# Patient Record
Sex: Male | Born: 1985 | Race: Black or African American | Hispanic: No | Marital: Single | Smoking: Never smoker
Health system: Southern US, Community
[De-identification: ages and names within clinical notes are randomized; demographics above are authoritative.]

## PROBLEM LIST (undated history)

## (undated) DIAGNOSIS — I10 Essential (primary) hypertension: Secondary | ICD-10-CM

---

## 2017-11-13 ENCOUNTER — Emergency Department (HOSPITAL_COMMUNITY)
Admission: EM | Admit: 2017-11-13 | Discharge: 2017-11-13 | Disposition: A | Discharge status: Home / Self Care | Payer: Self-pay | Attending: Emergency Medicine | Admitting: Emergency Medicine

## 2017-11-13 ENCOUNTER — Other Ambulatory Visit: Payer: Self-pay

## 2017-11-13 ENCOUNTER — Encounter (HOSPITAL_COMMUNITY): Payer: Self-pay | Admitting: Emergency Medical Services

## 2017-11-13 DIAGNOSIS — H109 Unspecified conjunctivitis: Secondary | ICD-10-CM

## 2017-11-13 DIAGNOSIS — H1033 Unspecified acute conjunctivitis, bilateral: Secondary | ICD-10-CM | POA: Insufficient documentation

## 2017-11-13 MED ORDER — POLYMYXIN B-TRIMETHOPRIM 10000-0.1 UNIT/ML-% OP SOLN: 1.0000 [drp] | OPHTHALMIC | 0 refills | Status: AC

## 2017-11-13 MED ORDER — TETRACAINE HCL 0.5 % OP SOLN
1.0000 [drp] | Freq: Once | OPHTHALMIC | Status: AC
  Administered 2017-11-13: 1 [drp] via OPHTHALMIC
  Filled 2017-11-13: qty 4

## 2017-11-13 NOTE — ED Notes (Signed)
Visual Acuity OS: 20/20 OD: 20/70

## 2017-11-13 NOTE — ED Notes (Signed)
Patient verbalized understanding of discharge instructions, no questions. Patient ambulated out of ED with steady gait in no distress.  

## 2017-11-13 NOTE — ED Triage Notes (Signed)
Patient arrives with c/o right eye pain. Patient states 4 weeks ago patient got something in eye, flushed eye, but has had swelling, tearing, and redness. Patient denies vision changes.

## 2017-11-13 NOTE — Discharge Instructions (Signed)
Please take antibiotic eyedrops for the next 5 days as prescribed.  Call and make an appointment with the eye doctor if your symptoms are not improving, (Dr. Alben Spittle).  I have listed information to his office below.

## 2017-11-13 NOTE — ED Provider Notes (Signed)
[facility]-EMERGENCY DEPT Provider Note   CSN: [id number] Arrival date & time: 11/13/17  1341     History   Chief Complaint Chief Complaint  Patient presents with  . Eye Pain    HPI Steven Valencia is a 32 y.o. male.  HPI   Steven Valencia is a 32yo male with no significant past medical history who presents to the emergency department for evaluation of red eye.  Patient reports that he felt something get stuck in his eye while at work 4 weeks ago.  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  He states that he flushed the eye out with cold water.  Ever since his right eye has been red and irritated.  He states that he has had clear and thick drainage from the eye.  He tried Visine drops and allergy drops without relief.  He denies changes in his vision, other than when the drainage starts to occlude his view.  He denies eye pain or painful EOMs.  He reports that the right eye also feels itchy.  No recent injury to the eye.  He denies headache, photophobia, vomiting, fever.  No known contacts with similar symptoms.  History reviewed. No pertinent past medical history.  There are no active problems to display for this patient.   History reviewed. No pertinent surgical history.      Home Medications    Prior to Admission medications   Medication Sig Start Date End Date Taking? Authorizing Provider  Multiple Vitamin (MULTIVITAMIN WITH MINERALS) TABS tablet Take 1 tablet by mouth daily.   Yes [provider]  trimethoprim-polymyxin b (POLYTRIM) ophthalmic solution Place 1 drop into the right eye every 4 (four) hours for 5 days. 11/13/17 11/18/17  Kellie Shropshire, PA-C    Family History No family history on file.  Social History Social History   Tobacco Use  . Smoking status: Not on file  Substance Use Topics  . Alcohol use: Not on file  . Drug use: Not on file     Allergies   Patient has  no known allergies.   Review of Systems Review of Systems Constitutional: Negative for chills and fever.  Eyes: Positive for discharge, redness and itching. Negative for photophobia, pain and visual disturbance.   Physical Exam Updated Vital Signs BP 132/88 (BP Location: Right Arm)   Pulse 79   Temp 98.5 F (36.9 C) (Oral)   Resp 16   Ht 5\' 7"  (1.702 m)   Wt 115.2 kg   SpO2 98%   BMI 39.78 kg/m   Physical Exam Constitutional: He appears well-developed and well-nourished. No distress.  HENT:  Head: Normocephalic and atraumatic.  Eyes: Right eye exhibits no discharge. Left eye exhibits no discharge.  Appearance. Right eye with conjunctival injection and drainage. PERRL intact. EOMI without pain or nystagmus. No consensual photophobia.  Corneal Abrasion Exam VCO. Risks, benefits and alternatives explained. 2 drops of tetracaine (PONTOCAINE) 0.5 % ophthalmic solution were applied to the right eye. Fluorescein 1 MG ophthalmic strip applied the the surface of the right eye Woods lamp used to screen for abrasion. No increase in fluorescein uptake. No corneal ulcer. No hyphema. No dendritic lesion. Negative Seidel sign. No foreign bodies noted. Eye flushed with sterile saline Patient tolerated the procedure well TONOPEN: 22RE  Pulmonary/Chest: Effort normal. No respiratory distress.  Neurological: He is alert. Coordination normal.  Skin: He is not diaphoretic.  Psychiatric: He has a normal mood and affect. His behavior is normal.  Nursing note and vitals reviewed.   ED Treatments / Results  Labs (all labs ordered are listed, but only abnormal results are displayed) Labs Reviewed - No data to display  EKG None  Radiology No results found.  Procedures Procedures (including critical care time)  Medications Ordered in ED Medications  tetracaine (PONTOCAINE) 0.5 % ophthalmic solution 1 drop (1 drop Right Eye Given 11/13/17 1501)     Initial Impression / Assessment and  Plan / ED Course  I have reviewed the triage vital signs and the nursing notes.  Pertinent labs & imaging results that were available during my care of the patient were reviewed by me and considered in my medical decision making (see chart for details).     Steven Valencia presents with symptoms consistent with bacterial conjunctivitis.  Purulent discharge exam.  No corneal abrasions, entrapment, consensual photophobia, or dendritic staining with fluorescein study.  Presentation non-concerning for iritis, corneal abrasions, or HSV.  No evidence of preseptal or orbital cellulitis.  Pt is not a contact lens wearer.  Patient will be given polytrim drops.  Personal hygiene and frequent handwashing discussed. Patient advised to followup with ophthalmologist for reevaluation if no improvement in symptoms. Patient verbalizes understanding and is agreeable with discharge.  Final Clinical Impressions(s) / ED Diagnoses   Final diagnoses:  Bacterial conjunctivitis of right eye    ED Discharge Orders         Ordered    trimethoprim-polymyxin b (POLYTRIM) ophthalmic solution  Every 4 hours     11/13/17 1454           Lawrence Marseilles 11/13/17 1830    Azalia Bilis, MD 11/14/17 2139

## 2018-08-20 ENCOUNTER — Emergency Department (HOSPITAL_COMMUNITY)
Admission: EM | Admit: 2018-08-20 | Discharge: 2018-08-20 | Disposition: A | Discharge status: Home / Self Care | Payer: Self-pay | Attending: Emergency Medicine | Admitting: Emergency Medicine

## 2018-08-20 ENCOUNTER — Emergency Department (HOSPITAL_COMMUNITY): Payer: Self-pay

## 2018-08-20 ENCOUNTER — Other Ambulatory Visit: Payer: Self-pay

## 2018-08-20 ENCOUNTER — Encounter (HOSPITAL_COMMUNITY): Payer: Self-pay

## 2018-08-20 DIAGNOSIS — Z79899 Other long term (current) drug therapy: Secondary | ICD-10-CM | POA: Insufficient documentation

## 2018-08-20 DIAGNOSIS — R0789 Other chest pain: Secondary | ICD-10-CM | POA: Insufficient documentation

## 2018-08-20 DIAGNOSIS — R202 Paresthesia of skin: Secondary | ICD-10-CM | POA: Insufficient documentation

## 2018-08-20 DIAGNOSIS — R0981 Nasal congestion: Secondary | ICD-10-CM | POA: Insufficient documentation

## 2018-08-20 LAB — BASIC METABOLIC PANEL
Anion gap: 13 (ref 5–15)
BUN: 10 mg/dL (ref 6–20)
CO2: 22 mmol/L (ref 22–32)
Calcium: 9 mg/dL (ref 8.9–10.3)
Chloride: 106 mmol/L (ref 98–111)
Creatinine, Ser: 0.85 mg/dL (ref 0.61–1.24)
GFR calc Af Amer: >60 mL/min (ref >60)
GFR calc non Af Amer: >60 mL/min (ref >60)
Glucose, Bld: 127 mg/dL — ABNORMAL HIGH (ref 70–99)
Potassium: 4.1 mmol/L (ref 3.5–5.1)
Sodium: 141 mmol/L (ref 135–145)

## 2018-08-20 LAB — CBG MONITORING, ED: Glucose-Capillary: 123 mg/dL — ABNORMAL HIGH (ref 70–99)

## 2018-08-20 LAB — CBC
HCT: 45 % (ref 39.0–52.0)
Hemoglobin: 15.7 g/dL (ref 13.0–17.0)
MCH: 27.2 pg (ref 26.0–34.0)
MCHC: 34.9 g/dL (ref 30.0–36.0)
MCV: 78 fL — ABNORMAL LOW (ref 80.0–100.0)
Platelets: 275 10*3/uL (ref 150–400)
RBC: 5.77 MIL/uL (ref 4.22–5.81)
RDW: 14.8 % (ref 11.5–15.5)
WBC: 9.5 10*3/uL (ref 4.0–10.5)
nRBC: 0 % (ref 0.0–0.2)

## 2018-08-20 MED ORDER — FLUTICASONE PROPIONATE 50 MCG/ACT NA SUSP: 2.0000 | Freq: Every day | NASAL | 0 refills | Status: DC

## 2018-08-20 NOTE — ED Provider Notes (Signed)
[facility]-EMERGENCY DEPT Provider Note   CSN: [id number] Arrival date & time: 08/20/18  [id number]    History   Chief Complaint Chief Complaint  Patient presents with   Generalized Body Aches    HPI Steven Valencia is a 33 y.o. male presents today for evaluation of acute onset, persistent subjective fevers and chills beginning last night.  Did not check his temperature but was able to go to sleep comfortably and awoke at 5 AM this morning with right-sided chest pain.  He describes it as a tightness.  It does not radiate.  It has been constant since it began at 5 AM, does not radiate.  He reports he has had similar pains previously that worsen laying flat, improves with rest.  It is not associated with nausea, vomiting, diaphoresis, lightheadedness, or shortness of breath.  He has not tried anything for his symptoms. No recent travel or surgeries, no hemoptysis, no leg swelling/pain, no hormone replacement therapy, no prior history of DVT or PE. Does note mild sinus pressure and nasal congestion this morning but states this is due to sleeping with a fan on.   Reports for the last 6 months or so he has had intermittent and progressively worsening in frequency paresthesias of all of his fingertips and sometimes all of his toes.  Symptoms come on with certain movements and activity.  He does not use tobacco products, smokes marijuana often, denies excessive alcohol intake.  No family history of cardiac disease at a young age.      The history is provided by the patient.    History reviewed. No pertinent past medical history.  There are no active problems to display for this patient.   No past surgical history on file.      Home Medications    Prior to Admission medications   Medication Sig Start Date End Date Taking? Authorizing Provider  Multiple Vitamin (MULTIVITAMIN WITH MINERALS) TABS tablet Take 1 tablet by mouth daily.   Yes [provider]    fluticasone (FLONASE) 50 MCG/ACT nasal spray Place 2 sprays into both nostrils daily. 08/20/18   Jeanie SewerFawze, Quanisha Drewry A, PA-C    Family History No family history on file.  Social History Social History   Tobacco Use   Smoking status: Never Smoker   Smokeless tobacco: Never Used  Substance Use Topics   Alcohol use: Not on file   Drug use: Not on file     Allergies   Patient has no known allergies.   Review of Systems Review of Systems  Constitutional: Negative for chills and fever.  Respiratory: Negative for shortness of breath.   Cardiovascular: Positive for chest pain.  All other systems reviewed and are negative.    Physical Exam Updated Vital Signs BP 135/87    Pulse 83    Temp 98.3 F (36.8 C) (Oral)    Resp 15    Ht 5\' 7"  (1.702 m)    Wt 127 kg    SpO2 95%    BMI 43.85 kg/m   Physical Exam Vitals signs and nursing note reviewed.  Constitutional:      General: He is not in acute distress.    Appearance: He is well-developed.     Comments: Resting comfortably in bed  HENT:     Head: Normocephalic and atraumatic.  Eyes:     General:        Right eye: No discharge.        Left eye: No discharge.  Conjunctiva/sclera: Conjunctivae normal.  Neck:     Musculoskeletal: Normal range of motion and neck supple.     Vascular: No JVD.     Trachea: No tracheal deviation.  Cardiovascular:     Rate and Rhythm: Normal rate and regular rhythm.     Pulses: Normal pulses.     Heart sounds: Normal heart sounds.     Comments: 2+ radial and DP/PT pulses bilaterally, Homans sign absent bilaterally, no lower extremity edema, no palpable cords, compartments are soft  Pulmonary:     Effort: Pulmonary effort is normal.     Breath sounds: Normal breath sounds.  Abdominal:     General: Abdomen is flat. Bowel sounds are normal. There is no distension.     Palpations: Abdomen is soft.     Tenderness: There is no abdominal tenderness. There is no guarding or rebound.  Skin:     General: Skin is warm and dry.     Findings: No erythema.  Neurological:     General: No focal deficit present.     Mental Status: He is alert.     Cranial Nerves: No cranial nerve deficit.     Sensory: No sensory deficit.     Motor: No weakness.     Coordination: Coordination normal.     Gait: Gait normal.     Comments: Fluent speech, no facial droop, cranial nerves appear grossly intact.  Moves extremities spontaneously with intact strength  Psychiatric:        Behavior: Behavior normal.      ED Treatments / Results  Labs (all labs ordered are listed, but only abnormal results are displayed) Labs Reviewed  BASIC METABOLIC PANEL - Abnormal; Notable for the following components:      Result Value   Glucose, Bld 127 (*)    All other components within normal limits  CBC - Abnormal; Notable for the following components:   MCV 78.0 (*)    All other components within normal limits  CBG MONITORING, ED - Abnormal; Notable for the following components:   Glucose-Capillary 123 (*)    All other components within normal limits    EKG EKG Interpretation  Date/Time:  Wednesday August 20 2018 09:24:57 EDT Ventricular Rate:  72 PR Interval:    QRS Duration: 79 QT Interval:  379 QTC Calculation: 415 R Axis:   42 Text Interpretation:  Sinus rhythm Low voltage, precordial leads No old tracing to compare Confirmed by Mancel BaleWentz, Elliott [phone]([id number]) on 08/20/2018 9:28:15 AM   Radiology Dg Chest 2 View  Result Date: 08/20/2018 CLINICAL DATA:  Chest pain. EXAM: CHEST - 2 VIEW COMPARISON:  None. FINDINGS: The heart size and mediastinal contours are within normal limits. Both lungs are clear. No pneumothorax or pleural effusion is noted. The visualized skeletal structures are unremarkable. IMPRESSION: No active cardiopulmonary disease. Electronically Signed   By: Lupita RaiderJames  Green Jr M.D.   On: 08/20/2018 09:25    Procedures Procedures (including critical care time)  Medications Ordered in  ED Medications - No data to display   Initial Impression / Assessment and Plan / ED Course  I have reviewed the triage vital signs and the nursing notes.  Pertinent labs & imaging results that were available during my care of the patient were reviewed by me and considered in my medical decision making (see chart for details).       Steven Valencia was evaluated in Emergency Department on 08/20/2018 for the symptoms described in the history of present illness. He  was evaluated in the context of the global COVID-19 pandemic, which necessitated consideration that the patient might be at risk for infection with the SARS-CoV-2 virus that causes COVID-19. Institutional protocols and algorithms that pertain to the evaluation of patients at risk for COVID-19 are in a state of rapid change based on information released by regulatory bodies including the CDC and federal and state organizations. These policies and algorithms were followed during the patient's care in the ED.  Patient presents for evaluation of subjective fevers since yesterday, right-sided chest pain today.  He is afebrile, initially hypertensive with resolution on reevaluation.  Nontoxic in appearance.  Pain is not exertional, pleuritic, or reproducible on palpation.  Has some sinus congestion today, also complains of intermittent paresthesias of the hands and feet for the last 6 months or so.  No focal neurologic deficits, normal neurologic examination and I doubt acute intracranial abnormality.  EKG shows normal sinus rhythm, no ischemic abnormalities or arrhythmia.  His symptoms sound very atypical and I doubt ACS/MI in a young individual with otherwise no risk factors.  He is PERC negative, doubt PE in this patient with no shortness of breath, increased work of breathing, tachycardia, hypoxia, or tachypnea.  Chest x-ray shows no acute cardiopulmonary abnormalities.  Blood work reviewed by me shows no leukocytosis, no anemia, no metabolic  derangements.  He is mildly hyperglycemic but doubt underlying/undiagnosed diabetes.  He is resting comfortably on reevaluation and no apparent distress.  Denies any pain.  Doubt dissection, cardiac tamponade, esophageal rupture, pneumothorax, or CHF.  No evidence of pericarditis or myocarditis.  Offered outpatient COVID-19 test which the patient declined, stating he had a negative test 3 weeks ago.  I think this is reasonable as he is no significant infectious symptoms and has been afebrile while in the ED, has not taken any antipyretics yesterday or today.  Recommend follow-up with his PCP for reevaluation of symptoms.  Discuss strict ED return precautions. Patient verbalized understanding of and agreement with plan and is safe for discharge home at this time.   Final Clinical Impressions(s) / ED Diagnoses   Final diagnoses:  Atypical chest pain  Paresthesia  Nasal congestion    ED Discharge Orders         Ordered    fluticasone (FLONASE) 50 MCG/ACT nasal spray  Daily     08/20/18 [address] PA-C 08/20/18 1056    Daleen Bo, MD 08/20/18 [phone]

## 2018-08-20 NOTE — ED Notes (Signed)
ED Provider at bedside. 

## 2018-08-20 NOTE — Discharge Instructions (Signed)
Your work-up today was reassuring, with no signs of heart attack, pneumonia, heart enlargement, or fluid in the lungs.   Use Flonase for nasal congestion.  Drink plenty fluids and plenty of rest.  Take ibuprofen or Tylenol for fever pain.  Follow-up with your primary care provider for reevaluation of your right-sided chest pains and the numbness and tingling in your hands and feet.  Return to the emergency department if any concerning signs or symptoms develop such as severe chest pains, shortness of breath, persistent vomiting, weakness, or loss of consciousness.

## 2018-08-20 NOTE — ED Notes (Addendum)
Patient ambulated to X-ray with X-Ray Tech.

## 2018-08-20 NOTE — ED Triage Notes (Addendum)
He states that, since yesterday he has at times felt "like I've got a fever--I never took my temperature". He also c/o paresthesias of all fingers and toes at times. He ie healthy-looking, ambulatory and in no distress. He also mentions feeling "kinda tight in my chest sometimes". [sic].

## 2018-08-29 ENCOUNTER — Other Ambulatory Visit: Payer: Self-pay

## 2018-08-29 ENCOUNTER — Emergency Department (HOSPITAL_COMMUNITY)
Admission: EM | Admit: 2018-08-29 | Discharge: 2018-08-29 | Disposition: A | Discharge status: Home / Self Care | Payer: Self-pay | Attending: Emergency Medicine | Admitting: Emergency Medicine

## 2018-08-29 ENCOUNTER — Encounter (HOSPITAL_COMMUNITY): Payer: Self-pay

## 2018-08-29 DIAGNOSIS — Z20822 Contact with and (suspected) exposure to covid-19: Secondary | ICD-10-CM

## 2018-08-29 DIAGNOSIS — R0602 Shortness of breath: Secondary | ICD-10-CM | POA: Insufficient documentation

## 2018-08-29 DIAGNOSIS — Z20828 Contact with and (suspected) exposure to other viral communicable diseases: Secondary | ICD-10-CM | POA: Insufficient documentation

## 2018-08-29 DIAGNOSIS — R05 Cough: Secondary | ICD-10-CM | POA: Insufficient documentation

## 2018-08-29 DIAGNOSIS — R0789 Other chest pain: Secondary | ICD-10-CM | POA: Insufficient documentation

## 2018-08-29 DIAGNOSIS — M791 Myalgia, unspecified site: Secondary | ICD-10-CM | POA: Insufficient documentation

## 2018-08-29 DIAGNOSIS — R0981 Nasal congestion: Secondary | ICD-10-CM | POA: Insufficient documentation

## 2018-08-29 DIAGNOSIS — J029 Acute pharyngitis, unspecified: Secondary | ICD-10-CM | POA: Insufficient documentation

## 2018-08-29 DIAGNOSIS — R509 Fever, unspecified: Secondary | ICD-10-CM | POA: Insufficient documentation

## 2018-08-29 DIAGNOSIS — Z79899 Other long term (current) drug therapy: Secondary | ICD-10-CM | POA: Insufficient documentation

## 2018-08-29 MED ORDER — BENZONATATE 100 MG PO CAPS: 100.0000 mg | ORAL_CAPSULE | Freq: Three times a day (TID) | ORAL | 0 refills | Status: DC

## 2018-08-29 MED ORDER — LIDOCAINE VISCOUS HCL 2 % MT SOLN: 15.0000 mL | OROMUCOSAL | 0 refills | Status: DC | PRN

## 2018-08-29 NOTE — ED Notes (Signed)
Discharge instructions reviewed with patient. Patient verbalizes understanding. VSS.   

## 2018-08-29 NOTE — ED Triage Notes (Signed)
Pt arrived with complaints of central chest pain when he lays down or takes a deep breath for the last week, pt states he came to the ED and they took a chest x-ray but sent him home.  Pt now having a scratchy throat. 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

## 2018-08-29 NOTE — ED Provider Notes (Signed)
[facility] DEPT Provider Note   CSN: [id number] Arrival date & time: 08/29/18  0601     History   Chief Complaint Chief Complaint  Patient presents with  . Sore Throat  . Chest Pain    HPI Steven Valencia is a 33 y.o. male.     The history is provided by the patient. No language interpreter was used.  Sore Throat Associated symptoms include chest pain.  Chest Pain    33 year old male who is a non-smoker presenting for evaluation of cold symptoms.  Patient report for nearly 2 weeks he has had cold symptoms.  Symptoms including sinus congestion, throat irritation, nonproductive cough, slight discomfort in the chest midsternal region, and an increase shortness of breath when he lays down.  He also endorsed subjective fever lasting for a day or 2 several days prior which is since resolved.  He endorsed mild body soreness.  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  He believes it may have contributed to his nasal congestion.  He did mention that a coworkers family member tested positive for COVID-19 but he does not think he had direct contact with that individual.  He denies any recent travel.  He does use marijuana on occasion.  He does not report any significant family history of cardiac disease.  No prior history of PE or DVT.  At home he has been using throat lozenges as well as Flonase with some improvement.  History reviewed. No pertinent past medical history.  There are no active problems to display for this patient.   History reviewed. No pertinent surgical history.      Home Medications    Prior to Admission medications   Medication Sig Start Date End Date Taking? Authorizing Provider  fluticasone (FLONASE) 50 MCG/ACT nasal spray Place 2 sprays into both nostrils daily. 08/20/18   Fawze, Mina A, PA-C  Multiple Vitamin (MULTIVITAMIN WITH MINERALS) TABS tablet Take 1 tablet by mouth daily.     [provider]    Family History No family history on file.  Social History Social History   Tobacco Use  . Smoking status: Never Smoker  . Smokeless tobacco: Never Used  Substance Use Topics  . Alcohol use: Not on file  . Drug use: Not on file     Allergies   Patient has no known allergies.   Review of Systems Review of Systems  Cardiovascular: Positive for chest pain.  All other systems reviewed and are negative.    Physical Exam Updated Vital Signs BP 134/87 (BP Location: Left Arm)   Pulse 73   Temp 98 F (36.7 C) (Oral)   Resp 16   Ht 5\' 7"  (1.702 m)   Wt 127 kg   SpO2 100%   BMI 43.85 kg/m   Physical Exam Vitals signs and nursing note reviewed.  Constitutional:      General: He is not in acute distress.    Appearance: He is well-developed.  HENT:     Head: Atraumatic.     Right Ear: Tympanic membrane normal.     Left Ear: Tympanic membrane normal.     Nose: No congestion or rhinorrhea.     Mouth/Throat:     Mouth: Mucous membranes are moist.     Pharynx: Uvula midline. No pharyngeal swelling or uvula swelling.  Eyes:     Conjunctiva/sclera: Conjunctivae normal.  Neck:     Musculoskeletal: Neck supple.  Cardiovascular:  Rate and Rhythm: Normal rate and regular rhythm.     Heart sounds: Normal heart sounds.  Pulmonary:     Effort: Pulmonary effort is normal.     Breath sounds: No wheezing, rhonchi or rales.  Abdominal:     Palpations: Abdomen is soft.     Tenderness: There is no abdominal tenderness.  Skin:    Findings: No rash.  Neurological:     Mental Status: He is alert.  Psychiatric:        Mood and Affect: Mood normal.      ED Treatments / Results  Labs (all labs ordered are listed, but only abnormal results are displayed) Labs Reviewed  NOVEL CORONAVIRUS, NAA (HOSPITAL ORDER, SEND-OUT TO REF LAB)    EKG None  Radiology No results found.  Procedures Procedures (including critical care time)   Medications Ordered in ED Medications - No data to display   Initial Impression / Assessment and Plan / ED Course  I have reviewed the triage vital signs and the nursing notes.  Pertinent labs & imaging results that were available during my care of the patient were reviewed by me and considered in my medical decision making (see chart for details).        BP 134/87 (BP Location: Left Arm)   Pulse 73   Temp 98 F (36.7 C) (Oral)   Resp 16   Ht 5\' 7"  (1.702 m)   Wt 127 kg   SpO2 100%   BMI 43.85 kg/m    Final Clinical Impressions(s) / ED Diagnoses   Final diagnoses:  Suspected Covid-19 Virus Infection    ED Discharge Orders         Ordered    lidocaine (XYLOCAINE) 2 % solution  As needed     08/29/18 0738    benzonatate (TESSALON) 100 MG capsule  Every 8 hours     08/29/18 0738         7:33 AM Patient presents with symptoms suggestive of potential COVID-19.  Will obtain test.  Encourage patient to self quarantine, work note provided.  Return precaution discussed.  Patient otherwise well-appearing, vital signs stable, no hypoxia, recent chest x-ray a week ago showing no acute finding.  Low suspicion for PE or ACS. EKG obtained and showing no concerning changes. Low suspicion for strep infection.  CENTOR criteria 2.  [name] was evaluated in Emergency Department on 08/29/2018 for the symptoms described in the history of present illness. He was evaluated in the context of the global COVID-19 pandemic, which necessitated consideration that the patient might be at risk for infection with the SARS-CoV-2 virus that causes COVID-19. Institutional protocols and algorithms that pertain to the evaluation of patients at risk for COVID-19 are in a state of rapid change based on information released by regulatory bodies including the CDC and federal and state organizations. These policies and algorithms were followed during the patient's care in the ED.    Fayrene Helperran, Trew Sunde, PA-C  08/29/18 0740    Mancel BaleWentz, Elliott, MD 08/29/18 [phone]

## 2018-08-30 LAB — NOVEL CORONAVIRUS, NAA (HOSP ORDER, SEND-OUT TO REF LAB; TAT 18-24 HRS): SARS-CoV-2, NAA: NOT DETECTED

## 2018-10-04 ENCOUNTER — Other Ambulatory Visit: Payer: Self-pay

## 2018-10-04 ENCOUNTER — Emergency Department (HOSPITAL_COMMUNITY)
Admission: EM | Admit: 2018-10-04 | Discharge: 2018-10-04 | Disposition: A | Discharge status: Home / Self Care | Payer: Self-pay | Attending: Emergency Medicine | Admitting: Emergency Medicine

## 2018-10-04 ENCOUNTER — Encounter (HOSPITAL_COMMUNITY): Payer: Self-pay

## 2018-10-04 ENCOUNTER — Emergency Department (HOSPITAL_COMMUNITY): Payer: Self-pay

## 2018-10-04 DIAGNOSIS — M5441 Lumbago with sciatica, right side: Secondary | ICD-10-CM | POA: Insufficient documentation

## 2018-10-04 DIAGNOSIS — I10 Essential (primary) hypertension: Secondary | ICD-10-CM | POA: Insufficient documentation

## 2018-10-04 DIAGNOSIS — Z79899 Other long term (current) drug therapy: Secondary | ICD-10-CM | POA: Insufficient documentation

## 2018-10-04 DIAGNOSIS — M5442 Lumbago with sciatica, left side: Secondary | ICD-10-CM | POA: Insufficient documentation

## 2018-10-04 DIAGNOSIS — F1210 Cannabis abuse, uncomplicated: Secondary | ICD-10-CM | POA: Insufficient documentation

## 2018-10-04 HISTORY — DX: Essential (primary) hypertension: I10

## 2018-10-04 MED ORDER — KETOROLAC TROMETHAMINE 30 MG/ML IJ SOLN
30.0000 mg | Freq: Once | INTRAMUSCULAR | Status: AC
  Administered 2018-10-04: 30 mg via INTRAMUSCULAR
  Filled 2018-10-04: qty 1

## 2018-10-04 MED ORDER — METHOCARBAMOL 500 MG PO TABS: 500.0000 mg | ORAL_TABLET | Freq: Two times a day (BID) | ORAL | 0 refills | Status: DC

## 2018-10-04 MED ORDER — LIDOCAINE 5 % EX PTCH
1.0000 | MEDICATED_PATCH | CUTANEOUS | Status: DC
  Administered 2018-10-04: 1 via TRANSDERMAL
  Filled 2018-10-04: qty 1

## 2018-10-04 MED ORDER — NAPROXEN 500 MG PO TABS: 500.0000 mg | ORAL_TABLET | Freq: Two times a day (BID) | ORAL | 0 refills | Status: DC

## 2018-10-04 MED ORDER — METHOCARBAMOL 500 MG PO TABS
500.0000 mg | ORAL_TABLET | Freq: Once | ORAL | Status: AC
  Administered 2018-10-04: 500 mg via ORAL
  Filled 2018-10-04: qty 1

## 2018-10-04 MED ORDER — METHYLPREDNISOLONE 4 MG PO TBPK: ORAL_TABLET | ORAL | 0 refills | Status: DC

## 2018-10-04 MED ORDER — HYDROCODONE-ACETAMINOPHEN 5-325 MG PO TABS
1.0000 | ORAL_TABLET | Freq: Once | ORAL | Status: AC
  Administered 2018-10-04: 1 via ORAL
  Filled 2018-10-04: qty 1

## 2018-10-04 NOTE — ED Provider Notes (Signed)
[facility]-EMERGENCY DEPT Provider Note   CSN: [id number] Arrival date & time: 10/04/18  1115     History   Chief Complaint Chief Complaint  Patient presents with  . Back Pain    HPI Steven Valencia is a 33 y.o. male.     Steven Valencia is a 33 y.o. male with history of hypertension, otherwise healthy, who presents to the emergency department for evaluation of low back pain that started 4 days ago when he woke up.  He denies any trauma or injury to the area.  Reports pain radiates down both legs primarily with movement, worse on the right than left.  He has had some mild back pain in the past but not previously this severe.  He denies any numbness or weakness.  No loss of bowel or bladder control.  No associated abdominal pain, urinary symptoms, fevers or chills.  He is taken Excedrin and Motrin without improvement, has not tried anything else to treat his symptoms.  No other aggravating or alleviating factors.     Past Medical History:  Diagnosis Date  . Hypertension     There are no active problems to display for this patient.   History reviewed. No pertinent surgical history.      Home Medications    Prior to Admission medications   Medication Sig Start Date End Date Taking? Authorizing Provider  benzonatate (TESSALON) 100 MG capsule Take 1 capsule (100 mg total) by mouth every 8 (eight) hours. 08/29/18   Fayrene Helperran, [place], PA-C  fluticasone (FLONASE) 50 MCG/ACT nasal spray Place 2 sprays into both nostrils daily. 08/20/18   Fawze, Mina A, PA-C  lidocaine (XYLOCAINE) 2 % solution Use as directed 15 mLs in the mouth or throat as needed for mouth pain. 08/29/18   Fayrene Helperran, [place], PA-C  Multiple Vitamin (MULTIVITAMIN WITH MINERALS) TABS tablet Take 1 tablet by mouth daily.    [provider]    Family History History reviewed. No pertinent family history.  Social History Social History   Tobacco Use  . Smoking status: Never Smoker  . Smokeless  tobacco: Never Used  Substance Use Topics  . Alcohol use: Yes  . Drug use: Yes    Types: Marijuana     Allergies   Patient has no known allergies.   Review of Systems Review of Systems  Constitutional: Negative for chills and fever.  HENT: Negative.   Respiratory: Negative for shortness of breath.   Cardiovascular: Negative for chest pain.  Gastrointestinal: Negative for abdominal pain, constipation, diarrhea, nausea and vomiting.  Genitourinary: Negative for dysuria, flank pain, frequency and hematuria.  Musculoskeletal: Positive for back pain. Negative for arthralgias, gait problem, joint swelling, myalgias and neck pain.  Skin: Negative for color change, rash and wound.  Neurological: Negative for weakness and numbness.     Physical Exam Updated Vital Signs BP 128/81   Pulse 81   Temp 98.8 F (37.1 C) (Oral)   Resp 18   Ht 5\' 7"  (1.702 m)   Wt 128.8 kg   SpO2 98%   BMI 44.48 kg/m   Physical Exam Vitals signs and nursing note reviewed.  Constitutional:      General: He is not in acute distress.    Appearance: Normal appearance. He is well-developed. He is obese. He is not diaphoretic.  HENT:     Head: Normocephalic and atraumatic.  Eyes:     General:        Right eye: No discharge.  Left eye: No discharge.  Neck:     Musculoskeletal: Neck supple.  Cardiovascular:     Pulses:          Radial pulses are 2+ on the right side and 2+ on the left side.       Dorsalis pedis pulses are 2+ on the right side and 2+ on the left side.       Posterior tibial pulses are 2+ on the right side and 2+ on the left side.  Pulmonary:     Effort: Pulmonary effort is normal. No respiratory distress.  Abdominal:     General: Bowel sounds are normal. There is no distension.     Palpations: Abdomen is soft. There is no mass.     Tenderness: There is no abdominal tenderness. There is no guarding.     Comments: Abdomen soft, nondistended, nontender to palpation in all  quadrants without guarding or peritoneal signs, no CVA tenderness bilaterally  Musculoskeletal:     Comments: Tenderness to palpation over the midline lumbar spine radiating into bilateral buttocks.  Pain made worse with range of motion of the lower extremities, positive straight leg raise  Skin:    General: Skin is warm and dry.     Capillary Refill: Capillary refill takes less than 2 seconds.  Neurological:     Mental Status: He is alert and oriented to person, place, and time.     Comments: Alert, clear speech, following commands. Moving all extremities without difficulty. Bilateral lower extremities with 5/5 strength in proximal and distal muscle groups and with dorsi and plantar flexion. Sensation intact in bilateral lower extremities. 2+ patellar DTRs bilaterally. Ambulatory with steady gait  Psychiatric:        Mood and Affect: Mood normal.        Behavior: Behavior normal.      ED Treatments / Results  Labs (all labs ordered are listed, but only abnormal results are displayed) Labs Reviewed - No data to display  EKG None  Radiology Dg Lumbar Spine Complete  Result Date: 10/04/2018 CLINICAL DATA:  Pt reported lower back pain for 4 days and woke up with pain that radiates down his left leg. EXAM: LUMBAR SPINE - COMPLETE 4+ VIEW COMPARISON:  None FINDINGS: Normal alignment. No acute fracture or subluxation. No lytic or blastic lesions. Regional bowel gas pattern is nonobstructive. IMPRESSION: Negative. Electronically Signed   By: Norva PavlovElizabeth  Brown M.D.   On: 10/04/2018 13:20    Procedures Procedures (including critical care time)  Medications Ordered in ED Medications  lidocaine (LIDODERM) 5 % 1 patch (1 patch Transdermal Patch Applied 10/04/18 1224)  HYDROcodone-acetaminophen (NORCO/VICODIN) 5-325 MG per tablet 1 tablet (1 tablet Oral Given 10/04/18 1224)  ketorolac (TORADOL) 30 MG/ML injection 30 mg (30 mg Intramuscular Given 10/04/18 1224)  methocarbamol (ROBAXIN)  tablet 500 mg (500 mg Oral Given 10/04/18 1224)     Initial Impression / Assessment and Plan / ED Course  I have reviewed the triage vital signs and the nursing notes.  Pertinent labs & imaging results that were available during my care of the patient were reviewed by me and considered in my medical decision making (see chart for details).  Normal neurological exam, no evidence of urinary incontinence or retention, pain is consistently reproducible. There is no evidence of AAA or concern for dissection at this time.   Patient can walk but states is painful.  No loss of bowel or bladder control.  No concern for cauda equina.  No  fever, night sweats, weight loss, h/o cancer, IVDU.  X-ray is unremarkable.  Pain treated here in the department with adequate improvement. RICE protocol and pain medicine indicated and discussed with patient. I have also discussed reasons to return immediately to the ER.  Patient expresses understanding and agrees with plan.   Final Clinical Impressions(s) / ED Diagnoses   Final diagnoses:  Acute midline low back pain with bilateral sciatica    ED Discharge Orders         Ordered    naproxen (NAPROSYN) 500 MG tablet  2 times daily     10/04/18 1357    methocarbamol (ROBAXIN) 500 MG tablet  2 times daily     10/04/18 1357    methylPREDNISolone (MEDROL DOSEPAK) 4 MG TBPK tablet     10/04/18 1357           Jacqlyn Larsen, [place] 10/04/18 1359    Virgel Manifold, MD 10/06/18 1002

## 2018-10-04 NOTE — Discharge Instructions (Signed)
You were seen here today for Back Pain: Low back pain is discomfort in the lower back that may be due to injuries to muscles and ligaments around the spine. Occasionally, it may be caused by a problem to a part of the spine called a disc. Your back pain should be treated with medicines listed below as well as back exercises and this back pain should get better over the next 2 weeks. Most patients get completely well in 4 weeks. It is important to know however, if you develop severe or worsening pain, low back pain with fever, numbness, weakness or inability to walk or urinate, you should return to the ER immediately.  Please follow up with your doctor this week for a recheck if still having symptoms.  HOME INSTRUCTIONS Self - care:  The application of heat can help soothe the pain.  Maintaining your daily activities, including walking (this is encouraged), as it will help you get better faster than just staying in bed. Do not life, push, pull anything more than 10 pounds for the next week. I am attaching back exercises that you can do at home to help facilitate your recovery.   Back Exercises - I have attached a handout on back exercises that can be done at home to help facilitate your recovery.   Medications are also useful to help with pain control.   Acetaminophen.  This medication is generally safe, and found over the counter. Take as directed for your age. You should not take more than 8 of the extra strength (500mg ) pills a day (max dose is 4000mg  total OVER one day)  Non steroidal anti inflammatory: This includes medications including Ibuprofen, naproxen and Mobic; These medications help both pain and swelling and are very useful in treating back pain.  They should be taken with food, as they can cause stomach upset, and more seriously, stomach bleeding. Do not combine the medications.   Muscle relaxants:  These medications can help with muscle tightness that is a cause of lower back pain.  Most  of these medications can cause drowsiness, and it is not safe to drive or use dangerous machinery while taking them. They are primarily helpful when taken at night before sleep.  Prednisone - This is an oral steroid.  This medication is best taken with food in the morning.  Please note that this medication can cause anxiety, mood swings, muscle fatigue, increased hunger, weight gain (sodium/fluid retention), poor sleep as well as other symptoms. If you are a diabetic, please monitor your blood sugars at home as this medication can increase your blood sugars. Call your pharmacist if you have any questions.  You will need to follow up with your primary healthcare provider or the Orthopedist in 1-2 weeks for reassessment and persistent symptoms.  Be aware that if you develop new symptoms, such as a fever, leg weakness, difficulty with or loss of control of your urine or bowels, abdominal pain, or more severe pain, you will need to seek medical attention and/or return to the Emergency department. Additional Information:  Your vital signs today were: BP 128/81    Pulse 81    Temp 98.8 F (37.1 C) (Oral)    Resp 18    Ht 5\' 7"  (1.702 m)    Wt 128.8 kg    SpO2 98%    BMI 44.48 kg/m  If your blood pressure (BP) was elevated above 135/85 this visit, please have this repeated by your doctor within one month. ---------------

## 2018-10-04 NOTE — ED Triage Notes (Signed)
States lower back pain for 4 days states woke up with pain and radiates down right leg no trauma voiced no bowel or bladder problems voiced.

## 2019-02-11 ENCOUNTER — Other Ambulatory Visit: Payer: Self-pay

## 2019-02-11 ENCOUNTER — Encounter (HOSPITAL_COMMUNITY): Payer: Self-pay | Admitting: Obstetrics and Gynecology

## 2019-02-11 ENCOUNTER — Emergency Department (HOSPITAL_COMMUNITY)
Admission: EM | Admit: 2019-02-11 | Discharge: 2019-02-11 | Disposition: A | Discharge status: Home / Self Care | Payer: PRIVATE HEALTH INSURANCE | Attending: Emergency Medicine | Admitting: Emergency Medicine

## 2019-02-11 DIAGNOSIS — E669 Obesity, unspecified: Secondary | ICD-10-CM | POA: Insufficient documentation

## 2019-02-11 DIAGNOSIS — Y999 Unspecified external cause status: Secondary | ICD-10-CM | POA: Diagnosis not present

## 2019-02-11 DIAGNOSIS — I10 Essential (primary) hypertension: Secondary | ICD-10-CM | POA: Diagnosis not present

## 2019-02-11 DIAGNOSIS — M436 Torticollis: Secondary | ICD-10-CM | POA: Diagnosis not present

## 2019-02-11 DIAGNOSIS — X500XXA Overexertion from strenuous movement or load, initial encounter: Secondary | ICD-10-CM | POA: Insufficient documentation

## 2019-02-11 DIAGNOSIS — Y929 Unspecified place or not applicable: Secondary | ICD-10-CM | POA: Insufficient documentation

## 2019-02-11 DIAGNOSIS — Z79899 Other long term (current) drug therapy: Secondary | ICD-10-CM | POA: Diagnosis not present

## 2019-02-11 DIAGNOSIS — S39012A Strain of muscle, fascia and tendon of lower back, initial encounter: Secondary | ICD-10-CM | POA: Insufficient documentation

## 2019-02-11 DIAGNOSIS — S3992XA Unspecified injury of lower back, initial encounter: Secondary | ICD-10-CM | POA: Diagnosis present

## 2019-02-11 DIAGNOSIS — Y9389 Activity, other specified: Secondary | ICD-10-CM | POA: Diagnosis not present

## 2019-02-11 MED ORDER — METHYLPREDNISOLONE 4 MG PO TBPK: ORAL_TABLET | ORAL | 0 refills | Status: AC

## 2019-02-11 MED ORDER — METHOCARBAMOL 500 MG PO TABS: 500.0000 mg | ORAL_TABLET | Freq: Two times a day (BID) | ORAL | 0 refills | Status: AC

## 2019-02-11 MED ORDER — NAPROXEN 500 MG PO TABS: 500.0000 mg | ORAL_TABLET | Freq: Two times a day (BID) | ORAL | 0 refills | Status: AC

## 2019-02-11 NOTE — ED Provider Notes (Signed)
[facility] DEPT Provider Note   CSN: [id number] Arrival date & time: 02/11/19  8502     History Chief Complaint  Patient presents with  . Back Pain  . Neck Pain    Steven Valencia is a 33 y.o. male.  The history is provided by the patient and medical records. No language interpreter was used.  Back Pain Associated symptoms: no fever and no numbness   Neck Pain Associated symptoms: no fever and no numbness        33 year old male presenting complaining of low back and neck pain.  Patient report for the past 6 days he has had pain to his left lower back radiates towards his hip and thigh.  Pain felt similar to sciatica that he had several months ago.  Described pain as a sharp shooting sensation with mild sensation to his left thigh.  Pain worse when he sits for prolonged periods and seems to improve when he move about.  Back pain ongoing for about 6 days.  Pain currently rated as 6 out of 10.  No associated bowel bladder incontinence no fever chills cough shortness of breath chest pain abdominal pain.  Left-sided neck pain started approximately 2 days ago.  Described as a sharp achy sensation, tightness, felt that he may have slept wrong.  Patient does admits to regular lifting approximately 25 to 30 pounds at work daily but no increased heavy lifting or any recent injury.  Dementia medication given to him several months ago for his back pain did help but he no longer have those medication.  No history of IV drug use active cancer.  No COVID-19 symptoms.    Past Medical History:  Diagnosis Date  . Hypertension     There are no problems to display for this patient.   No past surgical history on file.     No family history on file.  Social History   Tobacco Use  . Smoking status: Never Smoker  . Smokeless tobacco: Never Used  Substance Use Topics  . Alcohol use: Yes    Comment: Social  . Drug use: Yes    Frequency: 7.0 times per week   Types: Marijuana    Comment: Daily    Home Medications Prior to Admission medications   Medication Sig Start Date End Date Taking? Authorizing Provider  VITAMIN D PO Take 1 capsule by mouth daily.   Yes [provider]  benzonatate (TESSALON) 100 MG capsule Take 1 capsule (100 mg total) by mouth every 8 (eight) hours. Patient not taking: Reported on 02/11/2019 08/29/18   Domenic Moras, PA-C  fluticasone [facility]) 50 MCG/ACT nasal spray Place 2 sprays into both nostrils daily. Patient not taking: Reported on 02/11/2019 08/20/18   Rodell Perna A, PA-C  lidocaine (XYLOCAINE) 2 % solution Use as directed 15 mLs in the mouth or throat as needed for mouth pain. Patient not taking: Reported on 02/11/2019 08/29/18   Domenic Moras, PA-C  methocarbamol (ROBAXIN) 500 MG tablet Take 1 tablet (500 mg total) by mouth 2 (two) times daily. Patient not taking: Reported on 02/11/2019 10/04/18   Jacqlyn Larsen, PA-C  methylPREDNISolone (MEDROL DOSEPAK) 4 MG TBPK tablet Take as directed Patient not taking: Reported on 02/11/2019 10/04/18   Jacqlyn Larsen, PA-C  naproxen (NAPROSYN) 500 MG tablet Take 1 tablet (500 mg total) by mouth 2 (two) times daily. Patient not taking: Reported on 02/11/2019 10/04/18   Jacqlyn Larsen, PA-C    Allergies  Patient has no known allergies.  Review of Systems   Review of Systems  Constitutional: Negative for fever.  Musculoskeletal: Positive for back pain and neck pain.  Neurological: Negative for numbness.    Physical Exam Updated Vital Signs BP (!) 154/109 (BP Location: Right Arm)   Pulse 89   Temp 98.1 F (36.7 C) (Oral)   Resp 18   SpO2 100%   Physical Exam Vitals and nursing note reviewed.  Constitutional:      General: He is not in acute distress.    Appearance: He is well-developed. He is obese.  HENT:     Head: Atraumatic.  Eyes:     Conjunctiva/sclera: Conjunctivae normal.  Neck:     Comments: Mild tenderness to cervical paraspinal muscle on  palpation with full range of motion throughout the neck and no concerning skin changes. Musculoskeletal:        General: Tenderness (Tenderness to left lumbosacral region as well as left paraspinal muscle.  Negative straight leg raise.  5 out of 5 strength to bilateral lower extremities.  No foot drops.) present.     Cervical back: Normal range of motion and neck supple.  Skin:    Capillary Refill: Capillary refill takes less than 2 seconds.     Findings: No rash.  Neurological:     Mental Status: He is alert.     Comments: Ambulate without difficulty.  Psychiatric:        Mood and Affect: Mood normal.     ED Results / Procedures / Treatments   Labs (all labs ordered are listed, but only abnormal results are displayed) Labs Reviewed - No data to display  EKG None  Radiology No results found.  Procedures Procedures (including critical care time)  Medications Ordered in ED Medications - No data to display  ED Course  I have reviewed the triage vital signs and the nursing notes.  Pertinent labs & imaging results that were available during my care of the patient were reviewed by me and considered in my medical decision making (see chart for details).    MDM Rules/Calculators/A&P                      BP (!) 143/99 (BP Location: Right Arm)   Pulse 88   Temp 98.1 F (36.7 C) (Oral)   Resp 17   SpO2 100%   Final Clinical Impression(s) / ED Diagnoses Final diagnoses:  Torticollis  Strain of lumbar region, initial encounter    Rx / DC Orders ED Discharge Orders         Ordered    methocarbamol (ROBAXIN) 500 MG tablet  2 times daily     02/11/19 0924    methylPREDNISolone (MEDROL DOSEPAK) 4 MG TBPK tablet     02/11/19 0924    naproxen (NAPROSYN) 500 MG tablet  2 times daily     02/11/19 0924         9:22 AM Patient presents with neck pain as well as lower back pain.  Pain is likely musculoskeletal in origin.  No red flags.  Ambulate without difficulty.  Will  provide symptomatic treatment.  He is stable for discharge.   Fayrene Helper, PA-C 02/14/19 1603    Tegeler, Canary Brim, MD 02/16/19 [phone]

## 2019-02-11 NOTE — ED Triage Notes (Signed)
Patient reports back pain and neck pain. Patient reports he was "seen a few months ago" and diagnosed with sciatica. Patient states that he did the medication and felt better, but this past week he was lifting at his job and thinks he turned wrong and it began hurting again.  Patient reports hx of high BP but no  PCP

## 2019-02-11 NOTE — Progress Notes (Signed)
CSW received consult for patient regarding PCP needs. Patient was discharged before CSW could speak with patient. CSW attempted to call numbers listed on facesheet, but was only able to leave a VM. CSW will follow up.   [place] Circle, LCSW Transitions of Care Department [facility] ED [phone]

## 2019-02-11 NOTE — Progress Notes (Signed)
TOC CM contacted pt and provided him with contact information for Primary Care at [facility]. He will call to see if they accept his insurance. Also explained to pt that he can contact number on his insurance card to get a list of providers. Explained the importance with follow up with PCP to manage high blood pressure and assist with referrals to specialist for back pain. Verbalized understanding. [place], [place] ED TOC CM [phone]

## 2019-02-17 ENCOUNTER — Encounter: Payer: Self-pay | Admitting: Registered Nurse

## 2019-02-17 ENCOUNTER — Ambulatory Visit (INDEPENDENT_AMBULATORY_CARE_PROVIDER_SITE_OTHER): Payer: PRIVATE HEALTH INSURANCE | Admitting: Registered Nurse

## 2019-02-17 ENCOUNTER — Other Ambulatory Visit: Payer: Self-pay

## 2019-02-17 VITALS — BP 145/92 | HR 77 | Temp 97.7°F | Resp 17 | Ht 67.0 in | Wt 293.0 lb

## 2019-02-17 DIAGNOSIS — M5431 Sciatica, right side: Secondary | ICD-10-CM

## 2019-02-17 DIAGNOSIS — Z13220 Encounter for screening for lipoid disorders: Secondary | ICD-10-CM

## 2019-02-17 DIAGNOSIS — Z130 Encounter for screening for diseases of the blood and blood-forming organs and certain disorders involving the immune mechanism: Secondary | ICD-10-CM

## 2019-02-17 DIAGNOSIS — R200 Anesthesia of skin: Secondary | ICD-10-CM

## 2019-02-17 DIAGNOSIS — I10 Essential (primary) hypertension: Secondary | ICD-10-CM

## 2019-02-17 DIAGNOSIS — Z23 Encounter for immunization: Secondary | ICD-10-CM

## 2019-02-17 DIAGNOSIS — M545 Low back pain, unspecified: Secondary | ICD-10-CM

## 2019-02-17 DIAGNOSIS — F32A Depression, unspecified: Secondary | ICD-10-CM

## 2019-02-17 DIAGNOSIS — F329 Major depressive disorder, single episode, unspecified: Secondary | ICD-10-CM

## 2019-02-17 DIAGNOSIS — Z13228 Encounter for screening for other metabolic disorders: Secondary | ICD-10-CM

## 2019-02-17 DIAGNOSIS — R202 Paresthesia of skin: Secondary | ICD-10-CM

## 2019-02-17 DIAGNOSIS — Z1329 Encounter for screening for other suspected endocrine disorder: Secondary | ICD-10-CM | POA: Diagnosis not present

## 2019-02-17 MED ORDER — GABAPENTIN 300 MG PO CAPS: 300.0000 mg | ORAL_CAPSULE | Freq: Every day | ORAL | 0 refills | Status: AC

## 2019-02-17 MED ORDER — AMLODIPINE BESYLATE 5 MG PO TABS: 5.0000 mg | ORAL_TABLET | Freq: Every day | ORAL | 3 refills | Status: AC

## 2019-02-17 MED ORDER — CYCLOBENZAPRINE HCL 5 MG PO TABS: 5.0000 mg | ORAL_TABLET | Freq: Three times a day (TID) | ORAL | 1 refills | Status: AC | PRN

## 2019-02-17 MED ORDER — MELOXICAM 15 MG PO TABS: 15.0000 mg | ORAL_TABLET | Freq: Every day | ORAL | 0 refills | Status: AC

## 2019-02-17 MED ORDER — SERTRALINE HCL 50 MG PO TABS: ORAL_TABLET | ORAL | 0 refills | Status: DC

## 2019-02-17 NOTE — Progress Notes (Signed)
New Patient Office Visit  Subjective:  Patient ID: Steven Valencia, male    DOB: 01-25-1986  Age: 34 y.o. MRN: [id number]  CC:  Chief Complaint  Patient presents with  . Establish Care    est care and some lower back pain since october was taking medication not its back.    HPI Steven Valencia presents for visit to establish care. He has a number of issues he would like to address today:  HTN: recent history of high blood pressure, and he states he has a family history with multiple members of his family. No known family history of adverse CV events.  Depression: He states that he has felt this for years, but it has been more of an issue lately. Notes that he has some days that he feels he cannot get motivated to get anything done. Endorses labile mood. Notes that he has been self medicating with alcohol and marijuana.   Lower back pain: has been ongoing, worsening lately. Has been to [facility] twice for this - was given medrol dose pack, robaxan, naproxyn, had imaging done which was negative. Reports it is concentrated in the posterior aspect of his l hip radiating down his L leg. He was told at [facility] that it is likely sciatic pain. He states it will occasionally improve, but only temporarily. Has not been to PT.    Past Medical History:  Diagnosis Date  . Hypertension     History reviewed. No pertinent surgical history.  Family History  Problem Relation Age of Onset  . Anxiety disorder Mother   . Depression Mother   . Cancer Maternal Grandmother     Social History   Socioeconomic History  . Marital status: Single    Spouse name: Not on file  . Number of children: Not on file  . Years of education: Not on file  . Highest education level: Not on file  Occupational History    Employer: [employer]  Tobacco Use  . Smoking status: Never Smoker  . Smokeless tobacco: Never Used  Substance and Sexual Activity  . Alcohol use: Yes    Comment: Social  . Drug use: Yes    Frequency: 7.0 times  per week    Types: Marijuana    Comment: Daily  . Sexual activity: Yes  Other Topics Concern  . Not on file  Social History Narrative  . Not on file   Social Determinants of Health   Financial Resource Strain:   . Difficulty of Paying Living Expenses: Not on file  Food Insecurity:   . Worried About Programme researcher, broadcasting/film/video in the Last Year: Not on file  . Ran Out of Food in the Last Year: Not on file  Transportation Needs:   . Lack of Transportation (Medical): Not on file  . Lack of Transportation (Non-Medical): Not on file  Physical Activity:   . Days of Exercise per Week: Not on file  . Minutes of Exercise per Session: Not on file  Stress:   . Feeling of Stress : Not on file  Social Connections:   . Frequency of Communication with Friends and Family: Not on file  . Frequency of Social Gatherings with Friends and Family: Not on file  . Attends Religious Services: Not on file  . Active Member of Clubs or Organizations: Not on file  . Attends Banker Meetings: Not on file  . Marital Status: Not on file  Intimate Partner Violence:   . Fear of Current or  Ex-Partner: Not on file  . Emotionally Abused: Not on file  . Physically Abused: Not on file  . Sexually Abused: Not on file    ROS Review of Systems  Constitutional: Negative.   HENT: Negative.   Eyes: Negative.   Respiratory: Negative.   Cardiovascular: Negative.  Negative for chest pain, palpitations and leg swelling.  Gastrointestinal: Negative.   Endocrine: Negative.   Genitourinary: Negative.   Musculoskeletal: Positive for back pain and myalgias. Negative for arthralgias, gait problem, joint swelling, neck pain and neck stiffness.  Skin: Negative.   Allergic/Immunologic: Negative.   Neurological: Negative.   Hematological: Negative.   Psychiatric/Behavioral: Positive for agitation, decreased concentration and dysphoric mood. Negative for self-injury, sleep disturbance and suicidal ideas. The patient is  nervous/anxious.   All other systems reviewed and are negative.   Objective:   Today's Vitals: BP (!) 145/92   Pulse 77   Temp 97.7 F (36.5 C) (Temporal)   Resp 17   Ht 5\' 7"  (1.702 m)   Wt 293 lb (132.9 kg)   SpO2 97%   BMI 45.89 kg/m   Physical Exam Vitals and nursing note reviewed.  Constitutional:      Appearance: Normal appearance. He is normal weight.  Cardiovascular:     Rate and Rhythm: Normal rate and regular rhythm.     Pulses: Normal pulses.     Heart sounds: Normal heart sounds.  Pulmonary:     Effort: Pulmonary effort is normal. No respiratory distress.  Musculoskeletal:        General: Tenderness (lower left back) present. No swelling. Normal range of motion.  Skin:    General: Skin is warm and dry.     Capillary Refill: Capillary refill takes less than 2 seconds.     Coloration: Skin is not jaundiced or pale.     Findings: No bruising, erythema, lesion or rash.  Neurological:     General: No focal deficit present.     Mental Status: He is oriented to person, place, and time. Mental status is at baseline.  Psychiatric:        Mood and Affect: Mood normal.        Behavior: Behavior normal.        Thought Content: Thought content normal.        Judgment: Judgment normal.     Assessment & Plan:   Problem List Items Addressed This Visit    None    Visit Diagnoses    Numbness and tingling in both hands    -  Primary   Relevant Orders   Vitamin D, 25-hydroxy   Vitamin B12   Screening for endocrine, metabolic and immunity disorder       Relevant Orders   Comprehensive metabolic panel   CBC with Differential   TSH   Hemoglobin A1c   Lipid screening       Relevant Orders   Lipid Panel   Need for diphtheria-tetanus-pertussis (Tdap) vaccine       Relevant Orders   Tdap vaccine greater than or equal to 7yo IM (Completed)   Essential hypertension       Relevant Medications   amLODipine (NORVASC) 5 MG tablet   Lumbar pain       Relevant  Medications   meloxicam (MOBIC) 15 MG tablet   cyclobenzaprine (FLEXERIL) 5 MG tablet   Other Relevant Orders   Ambulatory referral to Physical Therapy   Sciatic pain, right       Relevant Medications  cyclobenzaprine (FLEXERIL) 5 MG tablet   gabapentin (NEURONTIN) 300 MG capsule   sertraline (ZOLOFT) 50 MG tablet   Other Relevant Orders   Ambulatory referral to Physical Therapy   Depression, unspecified depression type       Relevant Medications   sertraline (ZOLOFT) 50 MG tablet      Outpatient Encounter Medications as of 02/17/2019  Medication Sig  . methocarbamol (ROBAXIN) 500 MG tablet Take 1 tablet (500 mg total) by mouth 2 (two) times daily.  . methylPREDNISolone (MEDROL DOSEPAK) 4 MG TBPK tablet Take as directed  . naproxen (NAPROSYN) 500 MG tablet Take 1 tablet (500 mg total) by mouth 2 (two) times daily.  Marland Kitchen VITAMIN D PO Take 1 capsule by mouth daily.  Marland Kitchen amLODipine (NORVASC) 5 MG tablet Take 1 tablet (5 mg total) by mouth daily.  . cyclobenzaprine (FLEXERIL) 5 MG tablet Take 1 tablet (5 mg total) by mouth 3 (three) times daily as needed for muscle spasms.  Marland Kitchen gabapentin (NEURONTIN) 300 MG capsule Take 1 capsule (300 mg total) by mouth daily.  . meloxicam (MOBIC) 15 MG tablet Take 1 tablet (15 mg total) by mouth daily.  . sertraline (ZOLOFT) 50 MG tablet Take 0.5 tablets (25 mg total) by mouth daily for 8 days, THEN 1 tablet (50 mg total) daily.  . [DISCONTINUED] fluticasone (FLONASE) 50 MCG/ACT nasal spray Place 2 sprays into both nostrils daily. (Patient not taking: Reported on 02/11/2019)   No facility-administered encounter medications on file as of 02/17/2019.    Follow-up: Return in about 6 weeks (around 03/31/2019) for med check.   PLAN  Given meloxicam, flexeril, and gabapentin for pain. Discussed with the patient how to use these to best relieve pain. Discussed that sciatic pain is often best helped with physical therapy - will send referral.  He is being withheld  from work by his employer at this time due to his back pain - he is slated to see an orthopedic specialist on Jan 14 before being released back to work  Discussed options for depression/anxiety. He is interested in starting sertraline. Will start with 8 days of 25mg  PO qd, then increase to 50mg  PO qd in the mornings. He will return in 6 weeks for med check  BP mildly elevated today, but has been higher in the past, will start on 5mg  amlodipine PO qd today and recheck at his med check in 6 weeks.   Labs drawn today, will follow up as warranted  Histories reviewed with patient  Patient encouraged to call clinic with any questions, comments, or concerns.  , NP

## 2019-02-17 NOTE — Patient Instructions (Signed)
° ° ° °  If you have lab work done today you will be contacted with your lab results within the next 2 weeks.  If you have not heard from us then please contact us. The fastest way to get your results is to register for My Chart. ° ° °IF you received an x-ray today, you will receive an invoice from [facility]. Please contact [facility] at [phone] with questions or concerns regarding your invoice.  ° °IF you received labwork today, you will receive an invoice from [facility]. Please contact [facility] at [phone] with questions or concerns regarding your invoice.  ° °Our billing staff will not be able to assist you with questions regarding bills from these companies. ° °You will be contacted with the lab results as soon as they are available. The fastest way to get your results is to activate your My Chart account. Instructions are located on the last page of this paperwork. If you have not heard from us regarding the results in 2 weeks, please contact this office. °  ° ° ° °

## 2019-02-18 ENCOUNTER — Encounter: Payer: Self-pay | Admitting: Registered Nurse

## 2019-02-18 LAB — CBC WITH DIFFERENTIAL/PLATELET
Basophils Absolute: 0.1 10*3/uL (ref 0.0–0.2)
Basos: 0 %
EOS (ABSOLUTE): 0.1 10*3/uL (ref 0.0–0.4)
Eos: 1 %
Hematocrit: 45.9 % (ref 37.5–51.0)
Hemoglobin: 15.6 g/dL (ref 13.0–17.7)
Immature Grans (Abs): 0.2 10*3/uL — ABNORMAL HIGH (ref 0.0–0.1)
Immature Granulocytes: 1 %
Lymphocytes Absolute: 3.3 10*3/uL — ABNORMAL HIGH (ref 0.7–3.1)
Lymphs: 22 %
MCH: 26.8 pg (ref 26.6–33.0)
MCHC: 34 g/dL (ref 31.5–35.7)
MCV: 79 fL (ref 79–97)
Monocytes Absolute: 1.1 10*3/uL — ABNORMAL HIGH (ref 0.1–0.9)
Monocytes: 7 %
Neutrophils Absolute: 10.6 10*3/uL — ABNORMAL HIGH (ref 1.4–7.0)
Neutrophils: 69 %
Platelets: 284 10*3/uL (ref 150–450)
RBC: 5.82 x10E6/uL — ABNORMAL HIGH (ref 4.14–5.80)
RDW: 15 % (ref 11.6–15.4)
WBC: 15.4 10*3/uL — ABNORMAL HIGH (ref 3.4–10.8)

## 2019-02-18 LAB — COMPREHENSIVE METABOLIC PANEL
ALT: 22 IU/L (ref 0–44)
AST: 14 IU/L (ref 0–40)
Albumin/Globulin Ratio: 1.5 (ref 1.2–2.2)
Albumin: 4.2 g/dL (ref 4.0–5.0)
Alkaline Phosphatase: 59 IU/L (ref 39–117)
BUN/Creatinine Ratio: 16 (ref 9–20)
BUN: 14 mg/dL (ref 6–20)
Bilirubin Total: 0.2 mg/dL (ref 0.0–1.2)
CO2: 20 mmol/L (ref 20–29)
Calcium: 9.2 mg/dL (ref 8.7–10.2)
Chloride: 103 mmol/L (ref 96–106)
Creatinine, Ser: 0.86 mg/dL (ref 0.76–1.27)
GFR calc Af Amer: 134 mL/min/{1.73_m2} (ref >59)
GFR calc non Af Amer: 116 mL/min/{1.73_m2} (ref >59)
Globulin, Total: 2.8 g/dL (ref 1.5–4.5)
Glucose: 115 mg/dL — ABNORMAL HIGH (ref 65–99)
Potassium: 4.4 mmol/L (ref 3.5–5.2)
Sodium: 140 mmol/L (ref 134–144)
Total Protein: 7 g/dL (ref 6.0–8.5)

## 2019-02-18 LAB — LIPID PANEL
Chol/HDL Ratio: 4.3 ratio (ref 0.0–5.0)
Cholesterol, Total: 191 mg/dL (ref 100–199)
HDL: 44 mg/dL (ref >39)
LDL Chol Calc (NIH): 130 mg/dL — ABNORMAL HIGH (ref 0–99)
Triglycerides: 92 mg/dL (ref 0–149)
VLDL Cholesterol Cal: 17 mg/dL (ref 5–40)

## 2019-02-18 LAB — HEMOGLOBIN A1C
Est. average glucose Bld gHb Est-mCnc: 134 mg/dL
Hgb A1c MFr Bld: 6.3 % — ABNORMAL HIGH (ref 4.8–5.6)

## 2019-02-18 LAB — TSH: TSH: 1.95 u[IU]/mL (ref 0.450–4.500)

## 2019-02-18 LAB — VITAMIN B12: Vitamin B-12: 633 pg/mL (ref 232–1245)

## 2019-02-18 LAB — VITAMIN D 25 HYDROXY (VIT D DEFICIENCY, FRACTURES): Vit D, 25-Hydroxy: 28.5 ng/mL — ABNORMAL LOW (ref 30.0–100.0)

## 2019-02-18 NOTE — Progress Notes (Signed)
Labs returned  LDL 130, A1c 6.3 - suggest lifestyle management. Suspect recent weight gain is culprit. Vit D low to 28, take OTC supplement  WBC elevated to 15.3 - will recheck at next visit  Left VM with patient asking to call back. Will mail letter to patient as well.  Jari Sportsman, NP

## 2019-03-06 ENCOUNTER — Other Ambulatory Visit: Payer: Self-pay | Admitting: Sports Medicine

## 2019-03-06 DIAGNOSIS — M5440 Lumbago with sciatica, unspecified side: Secondary | ICD-10-CM

## 2019-03-07 ENCOUNTER — Ambulatory Visit
Admission: RE | Admit: 2019-03-07 | Discharge: 2019-03-07 | Disposition: A | Discharge status: Home / Self Care | Payer: PRIVATE HEALTH INSURANCE | Source: Ambulatory Visit | Attending: Sports Medicine | Admitting: Sports Medicine

## 2019-03-07 ENCOUNTER — Other Ambulatory Visit: Payer: Self-pay

## 2019-03-07 DIAGNOSIS — M5440 Lumbago with sciatica, unspecified side: Secondary | ICD-10-CM

## 2019-03-25 ENCOUNTER — Ambulatory Visit: Payer: PRIVATE HEALTH INSURANCE | Attending: Internal Medicine

## 2019-03-25 DIAGNOSIS — Z20822 Contact with and (suspected) exposure to covid-19: Secondary | ICD-10-CM

## 2019-03-26 LAB — NOVEL CORONAVIRUS, NAA: SARS-CoV-2, NAA: NOT DETECTED

## 2019-03-31 ENCOUNTER — Ambulatory Visit: Payer: PRIVATE HEALTH INSURANCE | Admitting: Registered Nurse

## 2019-05-11 ENCOUNTER — Encounter: Payer: Self-pay | Admitting: Registered Nurse

## 2019-05-11 ENCOUNTER — Other Ambulatory Visit: Payer: Self-pay

## 2019-05-11 ENCOUNTER — Ambulatory Visit (INDEPENDENT_AMBULATORY_CARE_PROVIDER_SITE_OTHER): Payer: BC Managed Care – PPO | Admitting: Registered Nurse

## 2019-05-11 VITALS — BP 133/77 | HR 80 | Temp 97.9°F | Resp 17 | Ht 67.0 in | Wt 289.2 lb

## 2019-05-11 DIAGNOSIS — F329 Major depressive disorder, single episode, unspecified: Secondary | ICD-10-CM | POA: Diagnosis not present

## 2019-05-11 DIAGNOSIS — R202 Paresthesia of skin: Secondary | ICD-10-CM

## 2019-05-11 DIAGNOSIS — R200 Anesthesia of skin: Secondary | ICD-10-CM

## 2019-05-11 DIAGNOSIS — F32A Depression, unspecified: Secondary | ICD-10-CM | POA: Insufficient documentation

## 2019-05-11 MED ORDER — SERTRALINE HCL 100 MG PO TABS: 100.0000 mg | ORAL_TABLET | Freq: Every day | ORAL | 3 refills | Status: AC

## 2019-05-11 NOTE — Progress Notes (Signed)
Established Patient Office Visit  Subjective:  Patient ID: Steven Valencia, male    DOB: Jun 17, 1985  Age: 34 y.o. MRN: [id number]  CC:  Chief Complaint  Patient presents with  . Follow-up    patient is here for f/u on the pain and tingling and numbness of hands and still having a problem with that and sometimes swelling  . 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 Last visit labs of vit d and b12 are not indicative of deficiencies causing his symptoms Denies neck pain, shoulder pain, or history of injury.  Symptoms radiate up forearms, but denies elbow pain or swelling fam hx of RA in mother and grandmother.  Otherwise, interested in increase in zoloft, as he feels he has plateaued. Has been taking amlodipine with good effect - bp wnl today.  Past Medical History:  Diagnosis Date  . Hypertension     No past surgical history on file.  Family History  Problem Relation Age of Onset  . Anxiety disorder Mother   . Depression Mother   . Cancer Maternal Grandmother     Social History   Socioeconomic History  . Marital status: Single    Spouse name: Not on file  . Number of children: Not on file  . Years of education: Not on file  . Highest education level: Not on file  Occupational History    Employer: [employer]  Tobacco Use  . Smoking status: Never Smoker  . Smokeless tobacco: Never Used  Substance and Sexual Activity  . Alcohol use: Yes    Comment: Social  . Drug use: Yes    Frequency: 7.0 times per week    Types: Marijuana    Comment: Daily  . Sexual activity: Yes  Other Topics Concern  . Not on file  Social History Narrative  . Not on file   Social Determinants of Health   Financial Resource Strain:   . Difficulty of Paying Living Expenses:   Food Insecurity:   . Worried  About Programme researcher, broadcasting/film/video in the Last Year:   . Barista in the Last Year:   Transportation Needs:   . Freight forwarder (Medical):   Marland Kitchen Lack of Transportation (Non-Medical):   Physical Activity:   . Days of Exercise per Week:   . Minutes of Exercise per Session:   Stress:   . Feeling of Stress :   Social Connections:   . Frequency of Communication with Friends and Family:   . Frequency of Social Gatherings with Friends and Family:   . Attends Religious Services:   . Active Member of Clubs or Organizations:   . Attends Banker Meetings:   Marland Kitchen Marital Status:   Intimate Partner Violence:   . Fear of Current or Ex-Partner:   . Emotionally Abused:   Marland Kitchen Physically Abused:   . Sexually Abused:     Outpatient Medications Prior to Visit  Medication Sig Dispense Refill  . amLODipine (NORVASC) 5 MG tablet Take 1 tablet (5 mg total) by mouth daily. 90 tablet 3  . cyclobenzaprine (FLEXERIL) 5 MG tablet Take 1 tablet (5 mg total) by mouth 3 (three) times daily as needed for muscle spasms. 30 tablet 1  . gabapentin (NEURONTIN) 300 MG capsule Take 1 capsule (300 mg  total) by mouth daily. 90 capsule 0  . meloxicam (MOBIC) 15 MG tablet Take 1 tablet (15 mg total) by mouth daily. 30 tablet 0  . methocarbamol (ROBAXIN) 500 MG tablet Take 1 tablet (500 mg total) by mouth 2 (two) times daily. 20 tablet 0  . methylPREDNISolone (MEDROL DOSEPAK) 4 MG TBPK tablet Take as directed 21 tablet 0  . naproxen (NAPROSYN) 500 MG tablet Take 1 tablet (500 mg total) by mouth 2 (two) times daily. 30 tablet 0  . VITAMIN D PO Take 1 capsule by mouth daily.    . fluticasone (FLONASE) 50 MCG/ACT nasal spray Place 2 sprays into both nostrils daily. (Patient not taking: Reported on 02/11/2019) 16 g 0  . sertraline (ZOLOFT) 50 MG tablet Take 0.5 tablets (25 mg total) by mouth daily for 8 days, THEN 1 tablet (50 mg total) daily. 60 tablet 0   No facility-administered medications prior to visit.     No Known Allergies  ROS Review of Systems  Constitutional: Negative.   HENT: Negative.   Eyes: Negative.   Respiratory: Negative.   Cardiovascular: Negative.   Gastrointestinal: Negative.   Endocrine: Negative.   Genitourinary: Negative.   Musculoskeletal: Negative.   Skin: Negative.   Allergic/Immunologic: Negative.   Neurological: Positive for weakness and numbness. Negative for dizziness, tremors, seizures, syncope, facial asymmetry, speech difficulty, light-headedness and headaches.  Hematological: Negative.   Psychiatric/Behavioral: Negative.   All other systems reviewed and are negative.     Objective:    Physical Exam  Constitutional: He is oriented to person, place, and time. He appears well-developed and well-nourished.  Cardiovascular: Normal rate and regular rhythm.  Pulmonary/Chest: Effort normal. No respiratory distress.  Musculoskeletal:        General: No tenderness, deformity or edema. Normal range of motion.     Comments: Strength 5/5 in hands bilat  Neurological: He is alert and oriented to person, place, and time.  Skin: Skin is warm and dry. No rash noted. No erythema. No pallor.  Psychiatric: He has a normal mood and affect. His behavior is normal. Judgment and thought content normal.  Nursing note and vitals reviewed.   BP 133/77   Pulse 80   Temp 97.9 F (36.6 C) (Temporal)   Resp 17   Ht 5\' 7"  (1.702 m)   Wt 289 lb 3.2 oz (131.2 kg)   SpO2 99%   BMI 45.30 kg/m  Wt Readings from Last 3 Encounters:  05/11/19 289 lb 3.2 oz (131.2 kg)  02/17/19 293 lb (132.9 kg)  10/04/18 284 lb (128.8 kg)     There are no preventive care reminders to display for this patient.  There are no preventive care reminders to display for this patient.  Lab Results  Component Value Date   TSH 1.950 02/17/2019   Lab Results  Component Value Date   WBC 15.4 (H) 02/17/2019   HGB 15.6 02/17/2019   HCT 45.9 02/17/2019   MCV 79 02/17/2019   PLT 284  02/17/2019   Lab Results  Component Value Date   NA 140 02/17/2019   K 4.4 02/17/2019   CO2 20 02/17/2019   GLUCOSE 115 (H) 02/17/2019   BUN 14 02/17/2019   CREATININE 0.86 02/17/2019   BILITOT 0.2 02/17/2019   ALKPHOS 59 02/17/2019   AST 14 02/17/2019   ALT 22 02/17/2019   PROT 7.0 02/17/2019   ALBUMIN 4.2 02/17/2019   CALCIUM 9.2 02/17/2019   ANIONGAP 13 08/20/2018   Lab Results  Component  Value Date   CHOL 191 02/17/2019   Lab Results  Component Value Date   HDL 44 02/17/2019   Lab Results  Component Value Date   LDLCALC 130 (H) 02/17/2019   Lab Results  Component Value Date   TRIG 92 02/17/2019   Lab Results  Component Value Date   CHOLHDL 4.3 02/17/2019   Lab Results  Component Value Date   HGBA1C 6.3 (H) 02/17/2019      Assessment & Plan:   Problem List Items Addressed This Visit      Other   Numbness and tingling in both hands - Primary   Relevant Orders   ANA   Vitamin D, 25-hydroxy   Sedimentation Rate   Ambulatory referral to Neurology   Depression   Relevant Medications   sertraline (ZOLOFT) 100 MG tablet      Meds ordered this encounter  Medications  . sertraline (ZOLOFT) 100 MG tablet    Sig: Take 1 tablet (100 mg total) by mouth daily.    Dispense:  30 tablet    Refill:  3    Order Specific Question:   Supervising Provider    Answer:   Doristine Bosworth [id number]    Follow-up: No follow-ups on file.   PLAN  Labs drawn, will follow up as warranted  Refer to neuro  Increase sertraline to 100mg  PO qd  Continue amlodipine at 5mg  PO qd  Patient encouraged to call clinic with any questions, comments, or concerns.  , NP

## 2019-05-11 NOTE — Patient Instructions (Signed)
° ° ° °  If you have lab work done today you will be contacted with your lab results within the next 2 weeks.  If you have not heard from us then please contact us. The fastest way to get your results is to register for My Chart. ° ° °IF you received an x-ray today, you will receive an invoice from [facility]. Please contact [facility] at [phone] with questions or concerns regarding your invoice.  ° °IF you received labwork today, you will receive an invoice from [facility]. Please contact [facility] at [phone] with questions or concerns regarding your invoice.  ° °Our billing staff will not be able to assist you with questions regarding bills from these companies. ° °You will be contacted with the lab results as soon as they are available. The fastest way to get your results is to activate your My Chart account. Instructions are located on the last page of this paperwork. If you have not heard from us regarding the results in 2 weeks, please contact this office. °  ° ° ° °

## 2019-05-12 LAB — VITAMIN D 25 HYDROXY (VIT D DEFICIENCY, FRACTURES): Vit D, 25-Hydroxy: 19.3 ng/mL — ABNORMAL LOW (ref 30.0–100.0)

## 2019-05-12 LAB — SEDIMENTATION RATE: Sed Rate: 3 mm/hr (ref 0–15)

## 2019-05-12 LAB — ANA: Anti Nuclear Antibody (ANA): NEGATIVE

## 2019-05-25 ENCOUNTER — Encounter: Payer: Self-pay | Admitting: Registered Nurse

## 2019-05-25 DIAGNOSIS — E559 Vitamin D deficiency, unspecified: Secondary | ICD-10-CM

## 2019-05-25 MED ORDER — VITAMIN D (ERGOCALCIFEROL) 1.25 MG (50000 UNIT) PO CAPS: 50000.0000 [IU] | ORAL_CAPSULE | ORAL | 0 refills | Status: AC

## 2019-06-23 ENCOUNTER — Ambulatory Visit: Payer: PRIVATE HEALTH INSURANCE | Admitting: Neurology

## 2020-04-27 IMAGING — MR MR LUMBAR SPINE W/O CM
4 of 6 series · 23 of 48 positions shown · non-contrast
Comparison: Plain films lumbar spine 10/04/2018.

CLINICAL DATA: Low back pain radiating into the left leg for 5
months. No known injury.

EXAM:
MRI LUMBAR SPINE WITHOUT CONTRAST
TECHNIQUE: Multiplanar, multisequence MR imaging of the lumbar spine was
performed. No intravenous contrast was administered.

[Series 6: T2 · sagittal · 4.0mm · 0.73mm/px · 4 of 12 slices shown (1 of 2)]
[im 1/12]
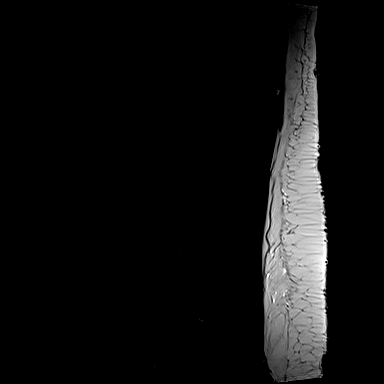
[im 4/12]
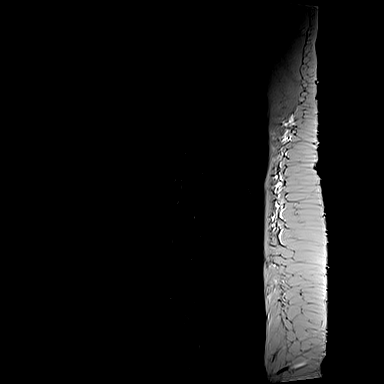
[im 8/12]
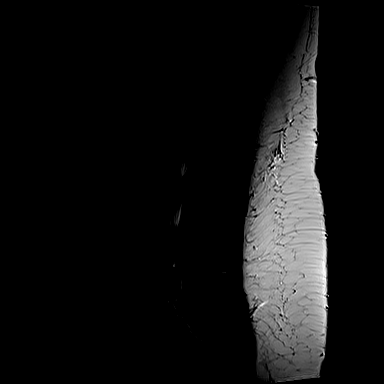
[im 12/12]
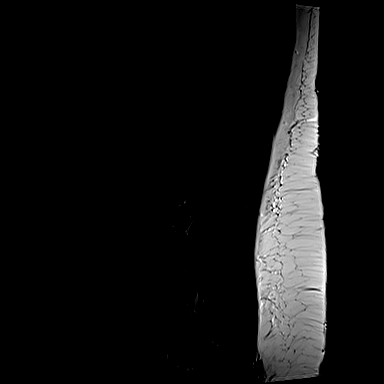

[Series 7: T1 · sagittal · 4.0mm · 0.88mm/px · 4 of 12 slices shown (1 of 2)]
[im 1/12]
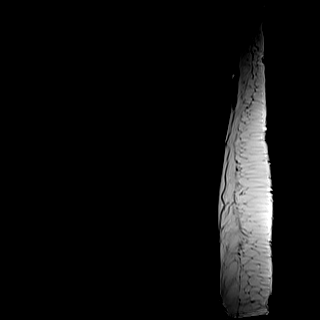
[im 4/12]
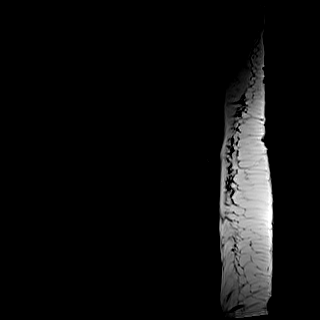
[im 8/12]
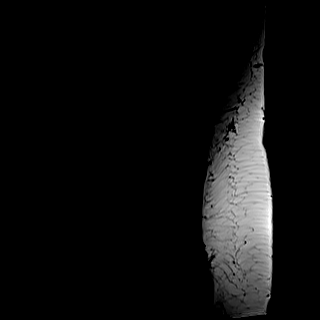
[im 12/12]
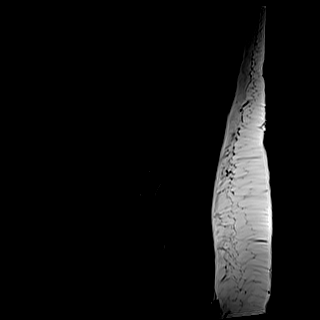

[Series 12: T1 · axial · 4.0mm · 0.35mm/px · z∈[-68,+112]mm · 6 of 38 slices shown (2 of 2)]
[im 1/38]
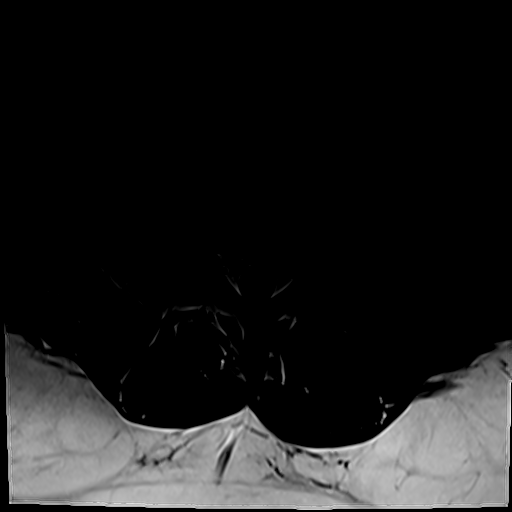
[im 6/38]
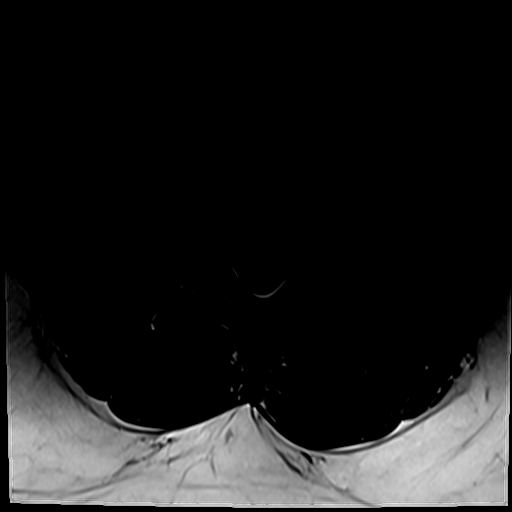
[im 11/38]
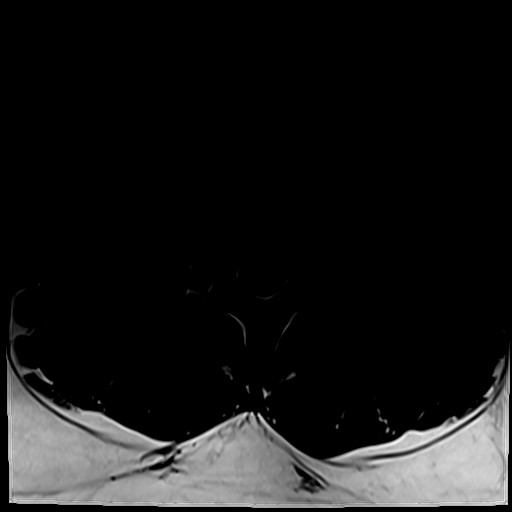
[im 16/38]
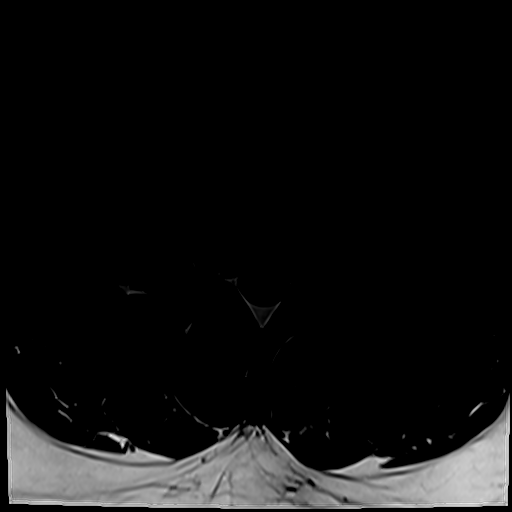
[im 19/38]
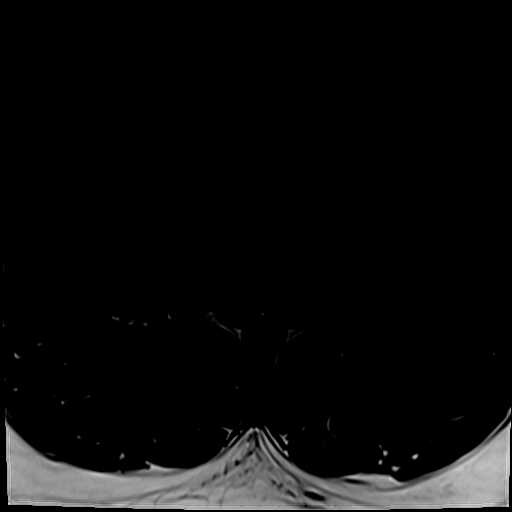
[im 32/38]
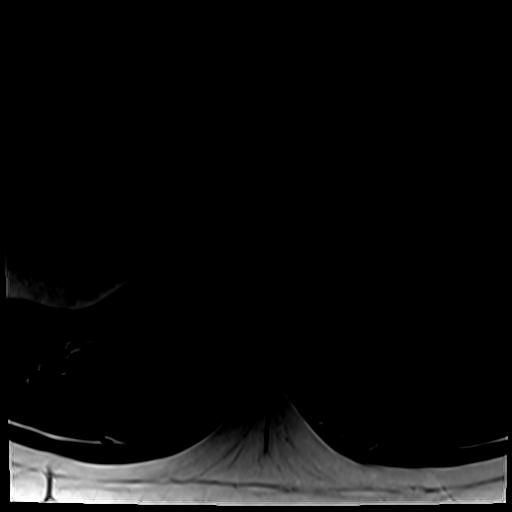

[Series 15: T2 · axial · 4.0mm · 0.35mm/px · z∈[-68,+142]mm · 9 of 38 slices shown (2 of 2)]
[im 1/38]
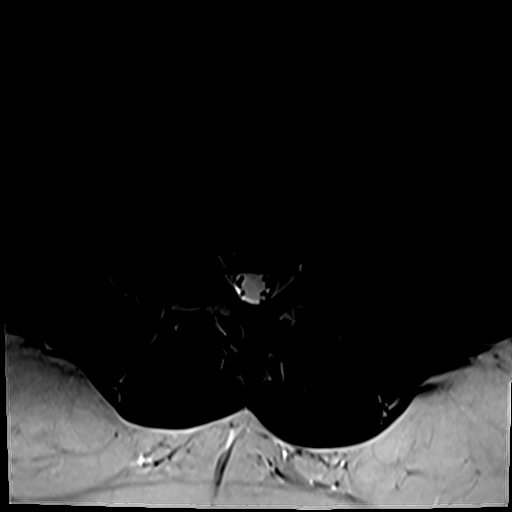
[im 6/38]
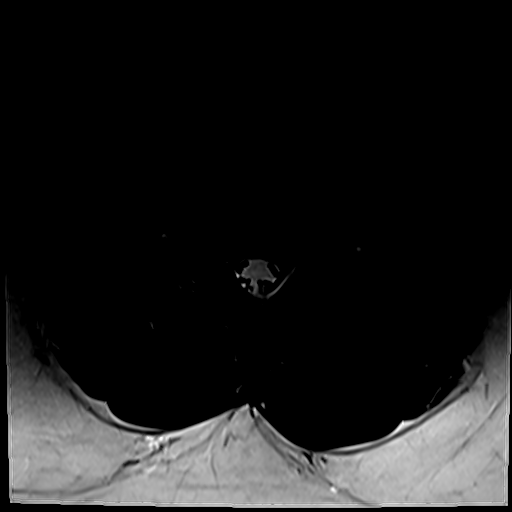
[im 11/38]
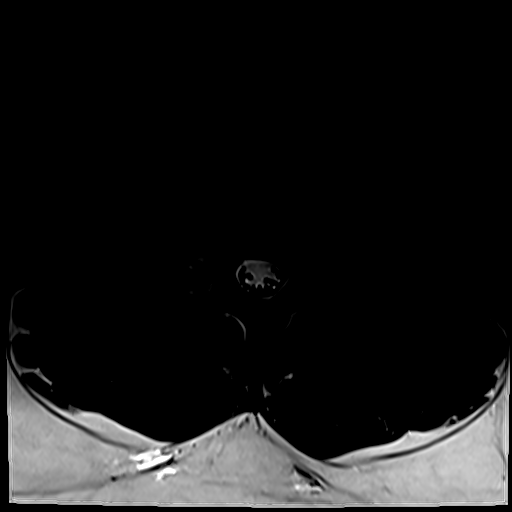
[im 16/38]
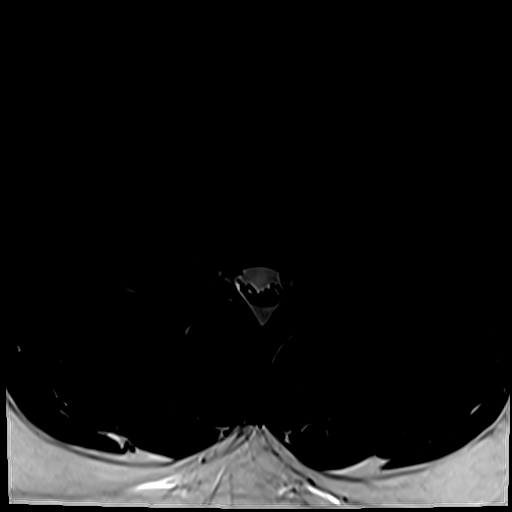
[im 19/38]
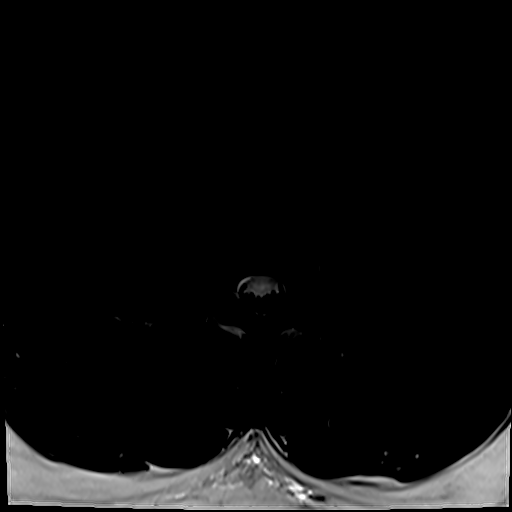
[im 22/38]
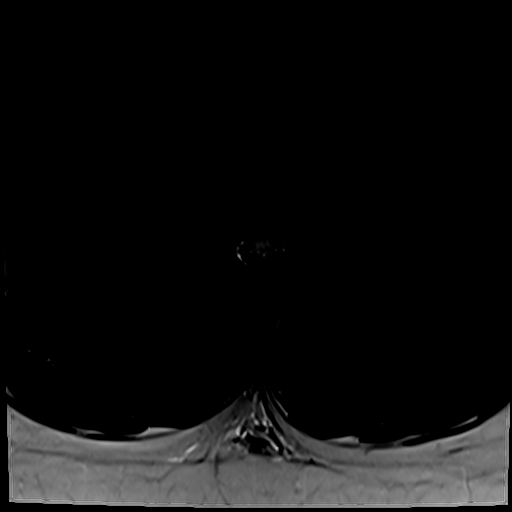
[im 27/38]
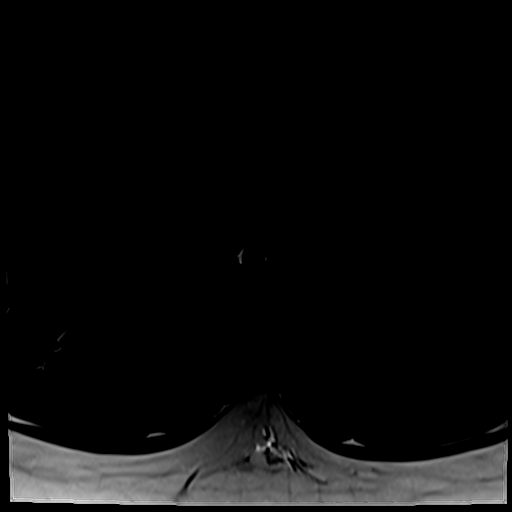
[im 32/38]
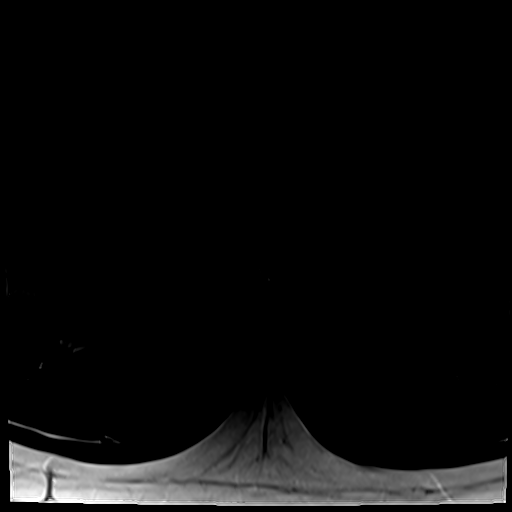
[im 38/38]
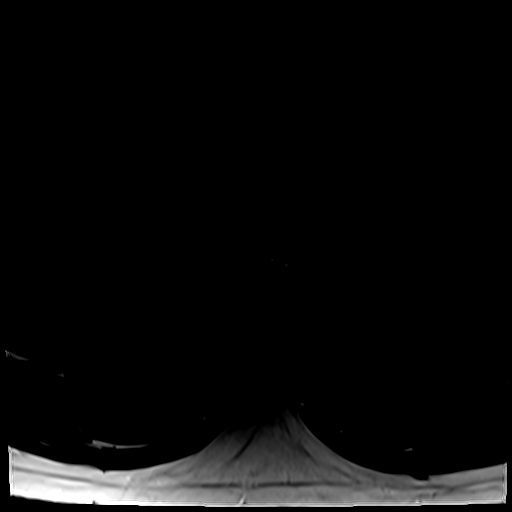

[23 of 48 positions shown; findings below may reference images not displayed]

FINDINGS: Segmentation:  Standard.

Alignment:  There is straightening of the normal lumbar lordosis.

Vertebrae:  No fracture, evidence of discitis, or bone lesion.

Conus medullaris and cauda equina: Conus extends to the L1 level.
Conus and cauda equina appear normal.

Paraspinal and other soft tissues: Negative.

Disc levels:

The T11-12 to L4-5 levels are negative. Disc height and hydration
are maintained at each level and the central canal and foramina are
widely patent.

L5-S1: The patient has a central disc protrusion contacting the S1
roots. The roots do not appear compressed. Neural foramina are open.
IMPRESSION: Central disc protrusion at L5-S1 contacts the S1 roots without
compression. The study is otherwise negative.
